# Patient Record
Sex: Female | Born: 1970 | Race: White | Hispanic: No | Marital: Married
Health system: Southern US, Community
[De-identification: ages and names within clinical notes are randomized; demographics above are authoritative.]

---

## 2014-01-01 ENCOUNTER — Other Ambulatory Visit (HOSPITAL_COMMUNITY): Payer: Self-pay | Admitting: Internal Medicine

## 2014-01-01 DIAGNOSIS — Z1231 Encounter for screening mammogram for malignant neoplasm of breast: Secondary | ICD-10-CM

## 2014-01-07 ENCOUNTER — Ambulatory Visit (HOSPITAL_COMMUNITY)
Admission: RE | Admit: 2014-01-07 | Discharge: 2014-01-07 | Disposition: A | Discharge status: Home / Self Care | Payer: 59 | Source: Ambulatory Visit | Attending: Internal Medicine | Admitting: Internal Medicine

## 2014-01-07 DIAGNOSIS — Z1231 Encounter for screening mammogram for malignant neoplasm of breast: Secondary | ICD-10-CM | POA: Diagnosis not present

## 2015-02-11 ENCOUNTER — Other Ambulatory Visit (HOSPITAL_COMMUNITY): Payer: Self-pay | Admitting: Internal Medicine

## 2015-02-11 ENCOUNTER — Ambulatory Visit (HOSPITAL_COMMUNITY)
Admission: RE | Admit: 2015-02-11 | Discharge: 2015-02-11 | Disposition: A | Discharge status: Home / Self Care | Payer: 59 | Source: Ambulatory Visit | Attending: Internal Medicine | Admitting: Internal Medicine

## 2015-02-11 DIAGNOSIS — Z1231 Encounter for screening mammogram for malignant neoplasm of breast: Secondary | ICD-10-CM

## 2015-03-12 DIAGNOSIS — H52223 Regular astigmatism, bilateral: Secondary | ICD-10-CM | POA: Diagnosis not present

## 2015-03-12 DIAGNOSIS — H5213 Myopia, bilateral: Secondary | ICD-10-CM | POA: Diagnosis not present

## 2015-07-21 DIAGNOSIS — F902 Attention-deficit hyperactivity disorder, combined type: Secondary | ICD-10-CM | POA: Diagnosis not present

## 2015-07-21 DIAGNOSIS — G43001 Migraine without aura, not intractable, with status migrainosus: Secondary | ICD-10-CM | POA: Diagnosis not present

## 2015-07-21 DIAGNOSIS — Z0000 Encounter for general adult medical examination without abnormal findings: Secondary | ICD-10-CM | POA: Diagnosis not present

## 2015-07-21 DIAGNOSIS — M5440 Lumbago with sciatica, unspecified side: Secondary | ICD-10-CM | POA: Diagnosis not present

## 2015-08-12 DIAGNOSIS — G43001 Migraine without aura, not intractable, with status migrainosus: Secondary | ICD-10-CM | POA: Diagnosis not present

## 2015-08-12 DIAGNOSIS — F909 Attention-deficit hyperactivity disorder, unspecified type: Secondary | ICD-10-CM | POA: Diagnosis not present

## 2015-08-12 DIAGNOSIS — E782 Mixed hyperlipidemia: Secondary | ICD-10-CM | POA: Diagnosis not present

## 2016-09-19 DIAGNOSIS — H5213 Myopia, bilateral: Secondary | ICD-10-CM | POA: Diagnosis not present

## 2016-09-19 DIAGNOSIS — H52223 Regular astigmatism, bilateral: Secondary | ICD-10-CM | POA: Diagnosis not present

## 2016-09-19 DIAGNOSIS — H524 Presbyopia: Secondary | ICD-10-CM | POA: Diagnosis not present

## 2016-10-11 ENCOUNTER — Other Ambulatory Visit (HOSPITAL_COMMUNITY): Payer: Self-pay | Admitting: Internal Medicine

## 2016-10-11 DIAGNOSIS — Z1231 Encounter for screening mammogram for malignant neoplasm of breast: Secondary | ICD-10-CM

## 2016-10-16 ENCOUNTER — Ambulatory Visit (HOSPITAL_COMMUNITY)
Admission: RE | Admit: 2016-10-16 | Discharge: 2016-10-16 | Disposition: A | Discharge status: Home / Self Care | Payer: 59 | Source: Ambulatory Visit | Attending: Internal Medicine | Admitting: Internal Medicine

## 2016-10-16 DIAGNOSIS — Z1231 Encounter for screening mammogram for malignant neoplasm of breast: Secondary | ICD-10-CM | POA: Insufficient documentation

## 2017-01-24 DIAGNOSIS — H6691 Otitis media, unspecified, right ear: Secondary | ICD-10-CM | POA: Diagnosis not present

## 2017-01-24 DIAGNOSIS — M791 Myalgia, unspecified site: Secondary | ICD-10-CM | POA: Diagnosis not present

## 2017-01-24 DIAGNOSIS — J029 Acute pharyngitis, unspecified: Secondary | ICD-10-CM | POA: Diagnosis not present

## 2017-05-15 DIAGNOSIS — Z0000 Encounter for general adult medical examination without abnormal findings: Secondary | ICD-10-CM | POA: Diagnosis not present

## 2017-05-18 DIAGNOSIS — E782 Mixed hyperlipidemia: Secondary | ICD-10-CM | POA: Diagnosis not present

## 2017-05-18 DIAGNOSIS — Z0000 Encounter for general adult medical examination without abnormal findings: Secondary | ICD-10-CM | POA: Diagnosis not present

## 2017-05-18 DIAGNOSIS — F39 Unspecified mood [affective] disorder: Secondary | ICD-10-CM | POA: Diagnosis not present

## 2017-05-18 DIAGNOSIS — G43019 Migraine without aura, intractable, without status migrainosus: Secondary | ICD-10-CM | POA: Diagnosis not present

## 2017-05-18 DIAGNOSIS — R197 Diarrhea, unspecified: Secondary | ICD-10-CM | POA: Diagnosis not present

## 2017-05-18 DIAGNOSIS — Z6834 Body mass index (BMI) 34.0-34.9, adult: Secondary | ICD-10-CM | POA: Diagnosis not present

## 2017-07-13 DIAGNOSIS — M545 Low back pain: Secondary | ICD-10-CM | POA: Diagnosis not present

## 2017-07-13 DIAGNOSIS — G43019 Migraine without aura, intractable, without status migrainosus: Secondary | ICD-10-CM | POA: Diagnosis not present

## 2017-07-13 DIAGNOSIS — Z6834 Body mass index (BMI) 34.0-34.9, adult: Secondary | ICD-10-CM | POA: Diagnosis not present

## 2017-07-13 DIAGNOSIS — Z0000 Encounter for general adult medical examination without abnormal findings: Secondary | ICD-10-CM | POA: Diagnosis not present

## 2017-07-13 DIAGNOSIS — R197 Diarrhea, unspecified: Secondary | ICD-10-CM | POA: Diagnosis not present

## 2017-07-13 DIAGNOSIS — F39 Unspecified mood [affective] disorder: Secondary | ICD-10-CM | POA: Diagnosis not present

## 2017-07-13 DIAGNOSIS — E782 Mixed hyperlipidemia: Secondary | ICD-10-CM | POA: Diagnosis not present

## 2017-07-13 MED FILL — CYCLOBENZAPRINE 10 MG TAB: 10 | 30 days supply | Qty: 60 | Fill #0

## 2017-07-13 MED FILL — ESCITALOPRAM 5 MG TABLET: 5 | 30 days supply | Qty: 30 | Fill #0

## 2017-07-31 MED FILL — AZITHROMYCIN 250 MG TABLET: 250 | 5 days supply | Qty: 6 | Fill #0

## 2017-08-07 MED FILL — FLUCONAZOLE 150 MG TABS: 150 | 1 days supply | Qty: 1 | Fill #0

## 2017-08-08 MED FILL — SHIPPING COST: 1 days supply | Qty: 1 | Fill #0

## 2017-08-08 MED FILL — ESCITALOPRAM 5 MG TABLET: 5 | 30 days supply | Qty: 30 | Fill #1

## 2017-09-12 MED FILL — SHIPPING COST: 1 days supply | Qty: 1 | Fill #1

## 2017-09-12 MED FILL — ESCITALOPRAM 5 MG TABLET: 5 | 30 days supply | Qty: 30 | Fill #2

## 2017-09-26 DIAGNOSIS — Z6834 Body mass index (BMI) 34.0-34.9, adult: Secondary | ICD-10-CM | POA: Diagnosis not present

## 2017-09-26 DIAGNOSIS — F3481 Disruptive mood dysregulation disorder: Secondary | ICD-10-CM | POA: Diagnosis not present

## 2017-09-26 DIAGNOSIS — R41840 Attention and concentration deficit: Secondary | ICD-10-CM | POA: Diagnosis not present

## 2017-09-26 MED FILL — AMPHETAMINE-DEXTROAMPHETAMI: 10 | 30 days supply | Qty: 30 | Fill #0

## 2017-09-26 MED FILL — ESCITALOPRAM 10 MG TABLET: 10 | 90 days supply | Qty: 90 | Fill #0

## 2017-09-26 MED FILL — SHIPPING COST: 1 days supply | Qty: 1 | Fill #2

## 2017-10-24 DIAGNOSIS — M722 Plantar fascial fibromatosis: Secondary | ICD-10-CM | POA: Diagnosis not present

## 2017-10-24 DIAGNOSIS — M25531 Pain in right wrist: Secondary | ICD-10-CM | POA: Diagnosis not present

## 2017-10-30 MED FILL — SHIPPING COST: 1 days supply | Qty: 1 | Fill #3

## 2017-10-30 MED FILL — AMPHETAMINE-DEXTROAMPHETAMI: 10 | 30 days supply | Qty: 60 | Fill #0

## 2017-11-01 DIAGNOSIS — M25571 Pain in right ankle and joints of right foot: Secondary | ICD-10-CM | POA: Diagnosis not present

## 2017-11-07 DIAGNOSIS — M25571 Pain in right ankle and joints of right foot: Secondary | ICD-10-CM | POA: Diagnosis not present

## 2017-12-05 DIAGNOSIS — M545 Low back pain: Secondary | ICD-10-CM | POA: Diagnosis not present

## 2017-12-05 MED FILL — predniSONE 10 MG (21) TBPK: 10 | 6 days supply | Qty: 21 | Fill #0

## 2017-12-05 MED FILL — SHIPPING COST: 1 days supply | Qty: 1 | Fill #4

## 2017-12-28 MED FILL — ESCITALOPRAM 10 MG TABLET: 10 | 90 days supply | Qty: 90 | Fill #0

## 2017-12-28 MED FILL — SHIPPING COST: 1 days supply | Qty: 1 | Fill #5

## 2017-12-28 MED FILL — AMPHETAMINE-DEXTROAMPHETAMI: 10 | 30 days supply | Qty: 60 | Fill #0

## 2018-03-14 DIAGNOSIS — R41840 Attention and concentration deficit: Secondary | ICD-10-CM | POA: Diagnosis not present

## 2018-03-14 DIAGNOSIS — F39 Unspecified mood [affective] disorder: Secondary | ICD-10-CM | POA: Diagnosis not present

## 2018-03-14 MED FILL — ESCITALOPRAM 10 MG TABLET: 10 | 90 days supply | Qty: 90 | Fill #0

## 2018-03-14 MED FILL — SHIPPING COST: 1 days supply | Qty: 1 | Fill #6

## 2018-03-14 MED FILL — AMPHETAMINE-DEXTROAMPHETAMI: 10 | 30 days supply | Qty: 60 | Fill #0

## 2018-05-31 MED FILL — CYCLOBENZAPRINE HCL 10 MG T: 10 | 30 days supply | Qty: 60 | Fill #0

## 2018-05-31 MED FILL — AMPHETAMINE-DEXTROAMPHETAMI: 10 | 30 days supply | Qty: 60 | Fill #0

## 2018-05-31 MED FILL — ESCITALOPRAM 10 MG TABLET: 10 | 90 days supply | Qty: 90 | Fill #0

## 2018-05-31 MED FILL — RIZATRIPTAN BENZOATE 10 MG: 10 | 30 days supply | Qty: 12 | Fill #0

## 2018-07-17 DIAGNOSIS — G43019 Migraine without aura, intractable, without status migrainosus: Secondary | ICD-10-CM | POA: Diagnosis not present

## 2018-07-17 DIAGNOSIS — E782 Mixed hyperlipidemia: Secondary | ICD-10-CM | POA: Diagnosis not present

## 2018-07-17 DIAGNOSIS — F3481 Disruptive mood dysregulation disorder: Secondary | ICD-10-CM | POA: Diagnosis not present

## 2018-07-17 DIAGNOSIS — Z0000 Encounter for general adult medical examination without abnormal findings: Secondary | ICD-10-CM | POA: Diagnosis not present

## 2018-07-17 DIAGNOSIS — F39 Unspecified mood [affective] disorder: Secondary | ICD-10-CM | POA: Diagnosis not present

## 2018-07-17 DIAGNOSIS — R41840 Attention and concentration deficit: Secondary | ICD-10-CM | POA: Diagnosis not present

## 2018-07-17 DIAGNOSIS — M545 Low back pain: Secondary | ICD-10-CM | POA: Diagnosis not present

## 2018-07-17 DIAGNOSIS — R197 Diarrhea, unspecified: Secondary | ICD-10-CM | POA: Diagnosis not present

## 2018-07-17 DIAGNOSIS — Z6834 Body mass index (BMI) 34.0-34.9, adult: Secondary | ICD-10-CM | POA: Diagnosis not present

## 2018-07-24 DIAGNOSIS — E669 Obesity, unspecified: Secondary | ICD-10-CM | POA: Diagnosis not present

## 2018-07-24 DIAGNOSIS — R41840 Attention and concentration deficit: Secondary | ICD-10-CM | POA: Diagnosis not present

## 2018-07-24 DIAGNOSIS — G43019 Migraine without aura, intractable, without status migrainosus: Secondary | ICD-10-CM | POA: Diagnosis not present

## 2018-07-24 DIAGNOSIS — G4700 Insomnia, unspecified: Secondary | ICD-10-CM | POA: Diagnosis not present

## 2018-07-24 DIAGNOSIS — F39 Unspecified mood [affective] disorder: Secondary | ICD-10-CM | POA: Diagnosis not present

## 2018-07-24 DIAGNOSIS — Z0001 Encounter for general adult medical examination with abnormal findings: Secondary | ICD-10-CM | POA: Diagnosis not present

## 2018-07-24 DIAGNOSIS — E782 Mixed hyperlipidemia: Secondary | ICD-10-CM | POA: Diagnosis not present

## 2018-07-24 MED FILL — AMPHETAMINE-DEXTROAMPHETAMI: 10 | 30 days supply | Qty: 60 | Fill #0

## 2018-09-20 MED FILL — AMPHETAMINE-DEXTROAMPHETAMI: 10 | 30 days supply | Qty: 60 | Fill #0

## 2018-10-17 MED FILL — ESCITALOPRAM 10 MG TABLET: 10 | 90 days supply | Qty: 90 | Fill #1

## 2018-10-23 DIAGNOSIS — F39 Unspecified mood [affective] disorder: Secondary | ICD-10-CM | POA: Diagnosis not present

## 2018-10-23 DIAGNOSIS — R41840 Attention and concentration deficit: Secondary | ICD-10-CM | POA: Diagnosis not present

## 2018-10-23 DIAGNOSIS — G43019 Migraine without aura, intractable, without status migrainosus: Secondary | ICD-10-CM | POA: Diagnosis not present

## 2018-10-23 MED FILL — ESCITALOPRAM 20 MG TABLET: 20 | 30 days supply | Qty: 30 | Fill #0

## 2018-10-23 MED FILL — clonazePAM 0.5 MG TABS: 0.5 | 30 days supply | Qty: 30 | Fill #0

## 2018-10-23 MED FILL — AMPHETAMINE-DEXTROAMPHETAMI: 10 | 30 days supply | Qty: 60 | Fill #0

## 2018-12-02 ENCOUNTER — Other Ambulatory Visit (HOSPITAL_COMMUNITY): Payer: Self-pay | Admitting: Internal Medicine

## 2018-12-02 DIAGNOSIS — Z1231 Encounter for screening mammogram for malignant neoplasm of breast: Secondary | ICD-10-CM

## 2018-12-12 ENCOUNTER — Ambulatory Visit (HOSPITAL_COMMUNITY): Payer: 59

## 2018-12-17 MED FILL — RIZATRIPTAN BENZOATE 10 MG: 10 | 20 days supply | Qty: 12 | Fill #0

## 2018-12-17 MED FILL — AMPHETAMINE-DEXTROAMPHETAMI: 10 | 30 days supply | Qty: 60 | Fill #0

## 2018-12-31 MED FILL — ESCITALOPRAM 20 MG TABLET: 20 | 30 days supply | Qty: 30 | Fill #1

## 2019-01-29 MED FILL — AMPHETAMINE-DEXTROAMPHETAMI: 10 | 30 days supply | Qty: 60 | Fill #0

## 2019-02-19 MED FILL — ESCITALOPRAM 20 MG TABLET: 20 | 30 days supply | Qty: 30 | Fill #2

## 2019-03-19 MED FILL — AMPHETAMINE-DEXTROAMPHETAMI: 10 | 30 days supply | Qty: 60 | Fill #0

## 2019-03-21 DIAGNOSIS — G43019 Migraine without aura, intractable, without status migrainosus: Secondary | ICD-10-CM | POA: Diagnosis not present

## 2019-03-21 DIAGNOSIS — F411 Generalized anxiety disorder: Secondary | ICD-10-CM | POA: Diagnosis not present

## 2019-03-21 DIAGNOSIS — R41840 Attention and concentration deficit: Secondary | ICD-10-CM | POA: Diagnosis not present

## 2019-03-21 DIAGNOSIS — F331 Major depressive disorder, recurrent, moderate: Secondary | ICD-10-CM | POA: Diagnosis not present

## 2019-05-05 ENCOUNTER — Ambulatory Visit (HOSPITAL_COMMUNITY): Payer: 59

## 2019-05-07 MED FILL — AMPHETAMINE-DEXTROAMPHETAMI: 10 | 30 days supply | Qty: 60 | Fill #0

## 2019-05-07 MED FILL — ESCITALOPRAM 10 MG TABLET: 10 | 90 days supply | Qty: 90 | Fill #0

## 2019-05-19 ENCOUNTER — Ambulatory Visit (HOSPITAL_COMMUNITY): Payer: 59

## 2019-05-22 ENCOUNTER — Other Ambulatory Visit: Payer: Self-pay

## 2019-05-22 ENCOUNTER — Encounter (HOSPITAL_COMMUNITY): Payer: Self-pay

## 2019-05-22 ENCOUNTER — Ambulatory Visit (HOSPITAL_COMMUNITY)
Admission: RE | Admit: 2019-05-22 | Discharge: 2019-05-22 | Disposition: A | Discharge status: Home / Self Care | Payer: 59 | Source: Ambulatory Visit | Attending: Internal Medicine | Admitting: Internal Medicine

## 2019-05-22 DIAGNOSIS — Z1231 Encounter for screening mammogram for malignant neoplasm of breast: Secondary | ICD-10-CM | POA: Insufficient documentation

## 2019-05-26 ENCOUNTER — Other Ambulatory Visit (HOSPITAL_COMMUNITY): Payer: Self-pay | Admitting: Internal Medicine

## 2019-05-26 DIAGNOSIS — R928 Other abnormal and inconclusive findings on diagnostic imaging of breast: Secondary | ICD-10-CM

## 2019-06-03 ENCOUNTER — Other Ambulatory Visit: Payer: Self-pay

## 2019-06-03 ENCOUNTER — Ambulatory Visit (HOSPITAL_COMMUNITY)
Admission: RE | Admit: 2019-06-03 | Discharge: 2019-06-03 | Disposition: A | Discharge status: Home / Self Care | Payer: 59 | Source: Ambulatory Visit | Attending: Internal Medicine | Admitting: Internal Medicine

## 2019-06-03 DIAGNOSIS — N922 Excessive menstruation at puberty: Secondary | ICD-10-CM | POA: Diagnosis not present

## 2019-06-03 DIAGNOSIS — R928 Other abnormal and inconclusive findings on diagnostic imaging of breast: Secondary | ICD-10-CM

## 2019-06-03 DIAGNOSIS — N6489 Other specified disorders of breast: Secondary | ICD-10-CM | POA: Diagnosis not present

## 2019-08-06 MED FILL — AMPHETAMINE SALTS 10 MG: 10 | 30 days supply | Qty: 60 | Fill #0

## 2019-09-10 MED FILL — ESCITALOPRAM 10 MG TABLET: 10 | 90 days supply | Qty: 90 | Fill #1

## 2019-09-16 MED FILL — RIZATRIPTAN BENZOATE 10 MG: 10 | 20 days supply | Qty: 12 | Fill #1

## 2019-10-01 MED FILL — AMPHETAMINE SALTS 10 MG: 10 | 30 days supply | Qty: 60 | Fill #0

## 2019-11-13 ENCOUNTER — Ambulatory Visit (INDEPENDENT_AMBULATORY_CARE_PROVIDER_SITE_OTHER): Payer: 59

## 2019-11-13 ENCOUNTER — Encounter: Payer: Self-pay | Admitting: Podiatry

## 2019-11-13 ENCOUNTER — Other Ambulatory Visit: Payer: Self-pay

## 2019-11-13 ENCOUNTER — Ambulatory Visit: Payer: 59 | Admitting: Podiatry

## 2019-11-13 DIAGNOSIS — R52 Pain, unspecified: Secondary | ICD-10-CM

## 2019-11-13 DIAGNOSIS — M722 Plantar fascial fibromatosis: Secondary | ICD-10-CM | POA: Diagnosis not present

## 2019-11-13 MED ORDER — DICLOFENAC SODIUM 75 MG PO TBEC: 75.0000 mg | DELAYED_RELEASE_TABLET | Freq: Two times a day (BID) | ORAL | 2 refills | Status: AC

## 2019-11-13 NOTE — Patient Instructions (Signed)
Plantar  Plantar Fasciitis  Plantar fasciitis is a painful foot condition that affects the heel. It occurs when the band of tissue that connects the toes to the heel bone (plantar fascia) becomes irritated. This can happen as the result of exercising too much or doing other repetitive activities (overuse injury). The pain from plantar fasciitis can range from mild irritation to severe pain that makes it difficult to walk or move. The pain is usually worse in the morning after sleeping, or after sitting or lying down for a while. Pain may also be worse after long periods of walking or standing. What are the causes? This condition may be caused by:  Standing for long periods of time.  Wearing shoes that do not have good arch support.  Doing activities that put stress on joints (high-impact activities), including running, aerobics, and ballet.  Being overweight.  An abnormal way of walking (gait).  Tight muscles in the back of your lower leg (calf).  High arches in your feet.  Starting a new athletic activity. What are the signs or symptoms? The main symptom of this condition is heel pain. Pain may:  Be worse with first steps after a time of rest, especially in the morning after sleeping or after you have been sitting or lying down for a while.  Be worse after long periods of standing still.  Decrease after 30-45 minutes of activity, such as gentle walking. How is this diagnosed? This condition may be diagnosed based on your medical history and your symptoms. Your health care provider may ask questions about your activity level. Your health care provider will do a physical exam to check for:  A tender area on the bottom of your foot.  A high arch in your foot.  Pain when you move your foot.  Difficulty moving your foot. You may have imaging tests to confirm the diagnosis, such as:  X-rays.  Ultrasound.  MRI. How is this treated? Treatment for plantar fasciitis depends on  how severe your condition is. Treatment may include:  Rest, ice, applying pressure (compression), and raising the affected foot (elevation). This may be called RICE therapy. Your health care provider may recommend RICE therapy along with over-the-counter pain medicines to manage your pain.  Exercises to stretch your calves and your plantar fascia.  A splint that holds your foot in a stretched, upward position while you sleep (night splint).  Physical therapy to relieve symptoms and prevent problems in the future.  Injections of steroid medicine (cortisone) to relieve pain and inflammation.  Stimulating your plantar fascia with electrical impulses (extracorporeal shock wave therapy). This is usually the last treatment option before surgery.  Surgery, if other treatments have not worked after 12 months. Follow these instructions at home:  Managing pain, stiffness, and swelling  If directed, put ice on the painful area: ? Put ice in a plastic bag, or use a frozen bottle of water. ? Place a towel between your skin and the bag or bottle. ? Roll the bottom of your foot over the bag or bottle. ? Do this for 20 minutes, 2-3 times a day.  Wear athletic shoes that have air-sole or gel-sole cushions, or try wearing soft shoe inserts that are designed for plantar fasciitis.  Raise (elevate) your foot above the level of your heart while you are sitting or lying down. Activity  Avoid activities that cause pain. Ask your health care provider what activities are safe for you.  Do physical therapy exercises and stretches  as told by your health care provider.  Try activities and forms of exercise that are easier on your joints (low-impact). Examples include swimming, water aerobics, and biking. General instructions  Take over-the-counter and prescription medicines only as told by your health care provider.  Wear a night splint while sleeping, if told by your health care provider. Loosen the  splint if your toes tingle, become numb, or turn cold and blue.  Maintain a healthy weight, or work with your health care provider to lose weight as needed.  Keep all follow-up visits as told by your health care provider. This is important. Contact a health care provider if you:  Have symptoms that do not go away after caring for yourself at home.  Have pain that gets worse.  Have pain that affects your ability to move or do your daily activities. Summary  Plantar fasciitis is a painful foot condition that affects the heel. It occurs when the band of tissue that connects the toes to the heel bone (plantar fascia) becomes irritated.  The main symptom of this condition is heel pain that may be worse after exercising too much or standing still for a long time.  Treatment varies, but it usually starts with rest, ice, compression, and elevation (RICE therapy) and over-the-counter medicines to manage pain. This information is not intended to replace advice given to you by your health care provider. Make sure you discuss any questions you have with your health care provider. Document Revised: 01/05/2017 Document Reviewed: 11/20/2016 Elsevier Patient Education  2020 [employer].

## 2019-11-15 NOTE — Progress Notes (Signed)
Subjective:   Patient ID: Jody Hall, female   DOB: 49 y.o.   MRN: [id number]   HPI Patient presents stating she has had several months of discomfort in her right heel and states that she does not remember specific injury.  States is been getting gradually worse recently and she has trouble with ambulation and patient does not smoke likes to be active   Review of Systems  All other systems reviewed and are negative.       Objective:  Physical Exam Vitals and nursing note reviewed.  Constitutional:      Appearance: She is well-developed.  Pulmonary:     Effort: Pulmonary effort is normal.  Musculoskeletal:        General: Normal range of motion.  Skin:    General: Skin is warm.  Neurological:     Mental Status: She is alert.     Neurovascular status was found to be intact muscle strength adequate range of motion was within normal limits.  Patient is noted to have exquisite discomfort plantar aspect right heel at the insertional point of the tendon into the calcaneus inflammation fluid around the medial band with moderate depression of the arch noted.  Patient has good digit perfusion well oriented and is on her feet for 12-hour shifts     Assessment:  Acute plantar fasciitis right inflammation fluid medial band     Plan:  H&P x-rays reviewed condition discussed.  I went ahead did sterile prep injected the fascia 3 mg Kenalog 5 mg Xylocaine and applied fascial brace with instructions and also gave instructions for physical therapy and shoe gear modifications.  Patient will be seen back in the next several weeks  X-rays indicate that there is small spur no indication stress fracture moderate depression of the arch

## 2019-11-21 ENCOUNTER — Other Ambulatory Visit (HOSPITAL_COMMUNITY): Payer: Self-pay | Admitting: Adult Health Nurse Practitioner

## 2019-11-21 MED FILL — clonazePAM 0.5 MG TABS: 0.5 | 30 days supply | Qty: 30 | Fill #0

## 2019-11-21 MED FILL — AMPHETAMINE SALTS 10 MG: 10 | 30 days supply | Qty: 60 | Fill #0

## 2019-11-24 ENCOUNTER — Telehealth: Payer: Self-pay | Admitting: Podiatry

## 2019-11-24 NOTE — Telephone Encounter (Signed)
Pt called and stated she never received her anti-inflammatory meds. Could you please send to her pharmacy. Pt pharmacy was aaded to chart

## 2019-11-26 ENCOUNTER — Ambulatory Visit: Payer: 59 | Admitting: Podiatry

## 2019-11-26 ENCOUNTER — Other Ambulatory Visit: Payer: Self-pay | Admitting: Podiatry

## 2019-11-26 MED ORDER — DICLOFENAC SODIUM 75 MG PO TBEC: 75.0000 mg | DELAYED_RELEASE_TABLET | Freq: Two times a day (BID) | ORAL | 2 refills | Status: DC

## 2019-11-26 MED FILL — DICLOFENAC SODIUM 75 MG TAB: 75 | 25 days supply | Qty: 50 | Fill #0

## 2019-11-26 NOTE — Telephone Encounter (Signed)
I resent it today.

## 2019-12-10 ENCOUNTER — Other Ambulatory Visit: Payer: Self-pay

## 2019-12-10 ENCOUNTER — Ambulatory Visit: Payer: 59 | Admitting: Podiatry

## 2019-12-10 ENCOUNTER — Encounter: Payer: Self-pay | Admitting: Podiatry

## 2019-12-10 DIAGNOSIS — M722 Plantar fascial fibromatosis: Secondary | ICD-10-CM | POA: Diagnosis not present

## 2019-12-10 NOTE — Progress Notes (Signed)
Subjective:   Patient ID: Jody Hall, female   DOB: 49 y.o.   MRN: [id number]   HPI Patient states right now she seems to be doing a lot better but she is wearing her boot most of the time   ROS      Objective:  Physical Exam  Neurovascular status intact intense plantar fascial symptomatology right which has improved but still is moderately painful with deep palpation     Assessment:  Acute fasciitis-like symptomatology right improved but present     Plan:  H&P reviewed condition and recommended continuation of boot therapy but gradual reduction over the next 4 weeks and discussed continued treatment for chronic fasciitis-like problem and all other issues associated with this.  Patient will be seen back and we answered all questions and did discuss the possibility for surgical intervention at 1 point in the future

## 2019-12-12 ENCOUNTER — Other Ambulatory Visit (HOSPITAL_COMMUNITY): Payer: Self-pay | Admitting: Adult Health Nurse Practitioner

## 2019-12-12 DIAGNOSIS — N3000 Acute cystitis without hematuria: Secondary | ICD-10-CM | POA: Diagnosis not present

## 2019-12-12 MED FILL — FLUCONAZOLE 150 MG TABS: 150 | 3 days supply | Qty: 2 | Fill #0

## 2019-12-12 MED FILL — NITROFURANTOIN MONO-MCR 100: 100 | 7 days supply | Qty: 14 | Fill #0

## 2020-01-07 DIAGNOSIS — Z23 Encounter for immunization: Secondary | ICD-10-CM | POA: Diagnosis not present

## 2020-01-16 ENCOUNTER — Other Ambulatory Visit (HOSPITAL_COMMUNITY): Payer: Self-pay | Admitting: Adult Health Nurse Practitioner

## 2020-01-16 MED FILL — AMPHETAMINE SALTS 10 MG: 10 | 30 days supply | Qty: 60 | Fill #0

## 2020-03-18 DIAGNOSIS — F39 Unspecified mood [affective] disorder: Secondary | ICD-10-CM | POA: Diagnosis not present

## 2020-03-18 DIAGNOSIS — E669 Obesity, unspecified: Secondary | ICD-10-CM | POA: Diagnosis not present

## 2020-03-18 DIAGNOSIS — G43019 Migraine without aura, intractable, without status migrainosus: Secondary | ICD-10-CM | POA: Diagnosis not present

## 2020-03-18 DIAGNOSIS — E782 Mixed hyperlipidemia: Secondary | ICD-10-CM | POA: Diagnosis not present

## 2020-03-18 DIAGNOSIS — R41840 Attention and concentration deficit: Secondary | ICD-10-CM | POA: Diagnosis not present

## 2020-03-18 DIAGNOSIS — Z0001 Encounter for general adult medical examination with abnormal findings: Secondary | ICD-10-CM | POA: Diagnosis not present

## 2020-03-18 DIAGNOSIS — Z6832 Body mass index (BMI) 32.0-32.9, adult: Secondary | ICD-10-CM | POA: Diagnosis not present

## 2020-03-18 DIAGNOSIS — Z0000 Encounter for general adult medical examination without abnormal findings: Secondary | ICD-10-CM | POA: Diagnosis not present

## 2020-03-18 DIAGNOSIS — F3481 Disruptive mood dysregulation disorder: Secondary | ICD-10-CM | POA: Diagnosis not present

## 2020-03-20 ENCOUNTER — Other Ambulatory Visit (HOSPITAL_COMMUNITY): Payer: Self-pay | Admitting: Student

## 2020-03-20 DIAGNOSIS — E782 Mixed hyperlipidemia: Secondary | ICD-10-CM | POA: Diagnosis not present

## 2020-03-20 DIAGNOSIS — F411 Generalized anxiety disorder: Secondary | ICD-10-CM | POA: Diagnosis not present

## 2020-03-20 DIAGNOSIS — F331 Major depressive disorder, recurrent, moderate: Secondary | ICD-10-CM | POA: Diagnosis not present

## 2020-03-20 DIAGNOSIS — G43019 Migraine without aura, intractable, without status migrainosus: Secondary | ICD-10-CM | POA: Diagnosis not present

## 2020-03-20 DIAGNOSIS — R41840 Attention and concentration deficit: Secondary | ICD-10-CM | POA: Diagnosis not present

## 2020-03-20 MED FILL — METHOCARBAMOL 500 MG TABS: 500 | 14 days supply | Qty: 56 | Fill #0

## 2020-03-20 MED FILL — AMPHETAMINE SALTS 10 MG: 10 | 30 days supply | Qty: 60 | Fill #0

## 2020-03-20 MED FILL — RIZATRIPTAN BENZOATE 10 MG: 10 | 28 days supply | Qty: 12 | Fill #0

## 2020-03-20 MED FILL — clonazePAM 0.5 MG TABS: 0.5 | 30 days supply | Qty: 30 | Fill #0

## 2020-04-29 ENCOUNTER — Other Ambulatory Visit (HOSPITAL_BASED_OUTPATIENT_CLINIC_OR_DEPARTMENT_OTHER): Payer: Self-pay

## 2020-05-19 ENCOUNTER — Other Ambulatory Visit (HOSPITAL_COMMUNITY): Payer: Self-pay

## 2020-05-20 ENCOUNTER — Other Ambulatory Visit (HOSPITAL_COMMUNITY): Payer: Self-pay

## 2020-05-20 MED ORDER — AMPHETAMINE-DEXTROAMPHETAMINE 10 MG PO TABS
10.0000 mg | ORAL_TABLET | Freq: Two times a day (BID) | ORAL | 0 refills | Status: AC
  Filled 2020-05-20: qty 60, 30d supply, fill #0

## 2020-05-21 ENCOUNTER — Other Ambulatory Visit (HOSPITAL_COMMUNITY): Payer: Self-pay

## 2020-05-21 ENCOUNTER — Other Ambulatory Visit (HOSPITAL_BASED_OUTPATIENT_CLINIC_OR_DEPARTMENT_OTHER): Payer: Self-pay

## 2020-05-24 ENCOUNTER — Other Ambulatory Visit (HOSPITAL_COMMUNITY): Payer: Self-pay

## 2020-06-12 IMAGING — MG DIGITAL SCREENING BILAT W/ TOMO W/ CAD
8 series · 8 of 24 positions shown · non-contrast
Comparison: Previous exam(s).

CLINICAL DATA: Screening.

EXAM:
DIGITAL SCREENING BILATERAL MAMMOGRAM WITH TOMO AND CAD

[R MLO synth-2D]
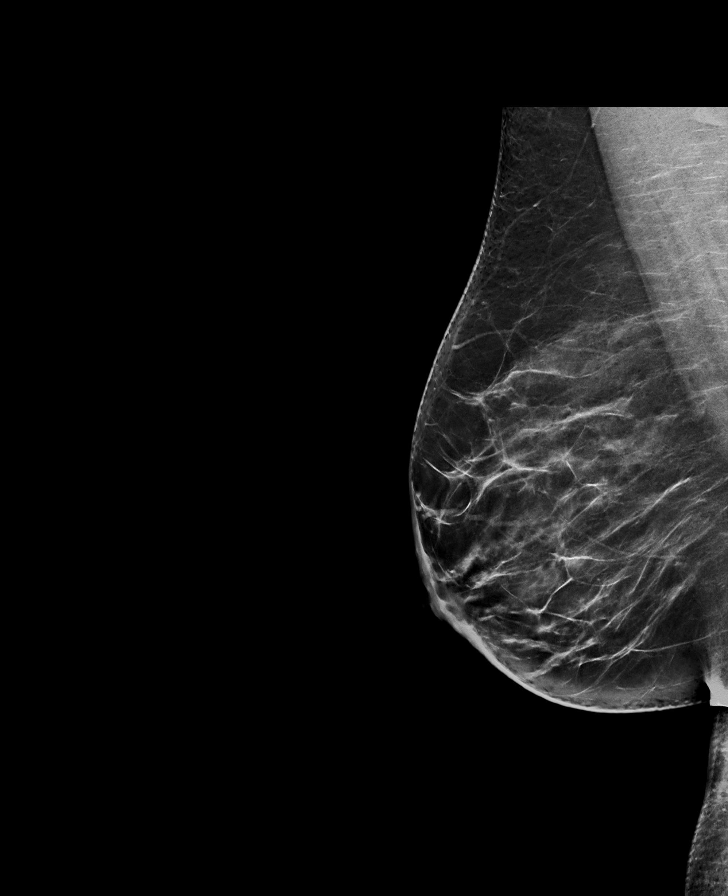

[R CC synth-2D]
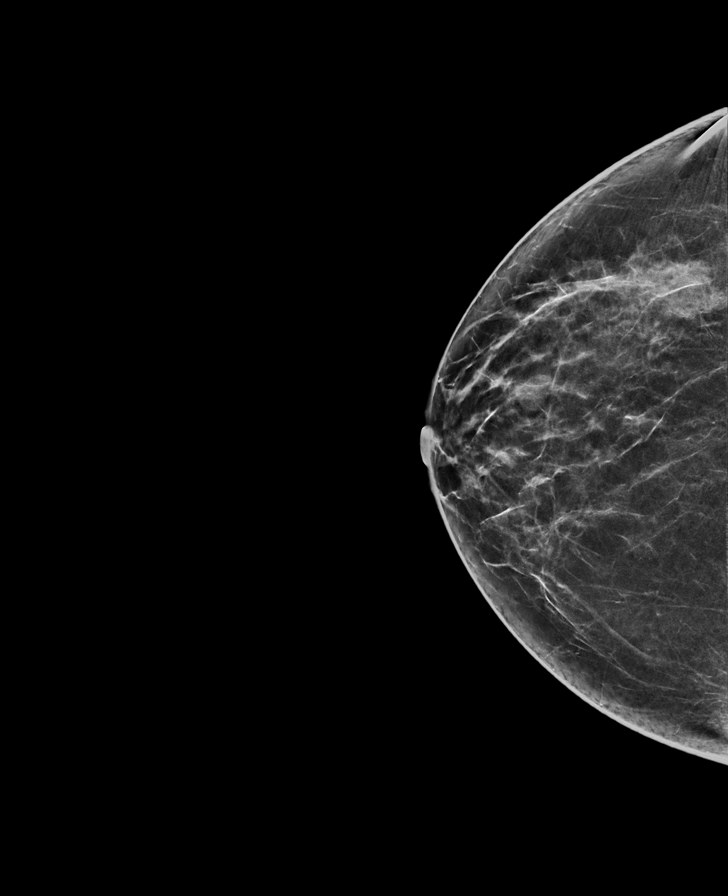

[L CC synth-2D]
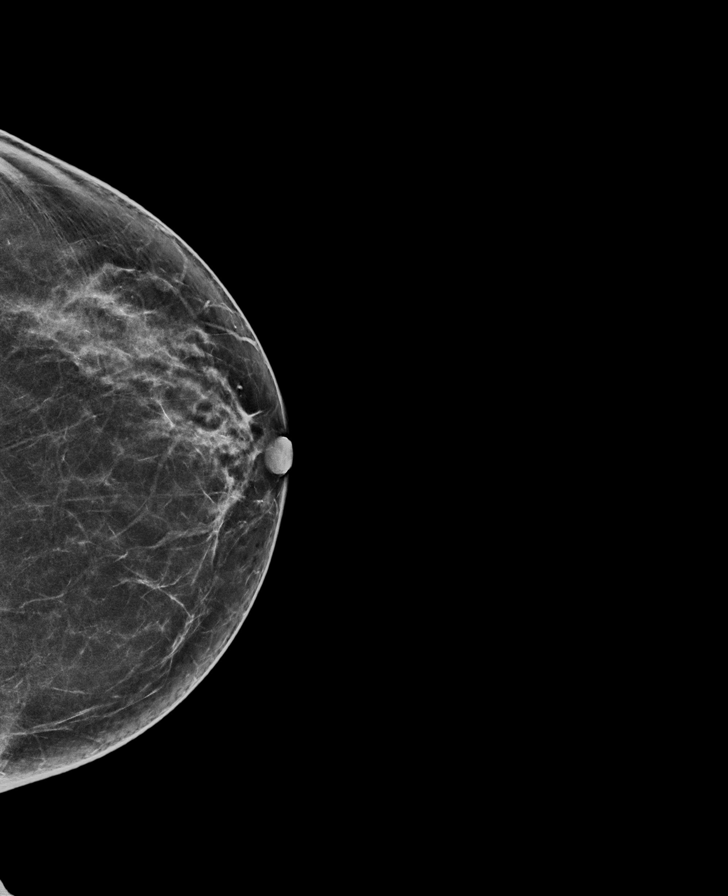

[L MLO synth-2D]
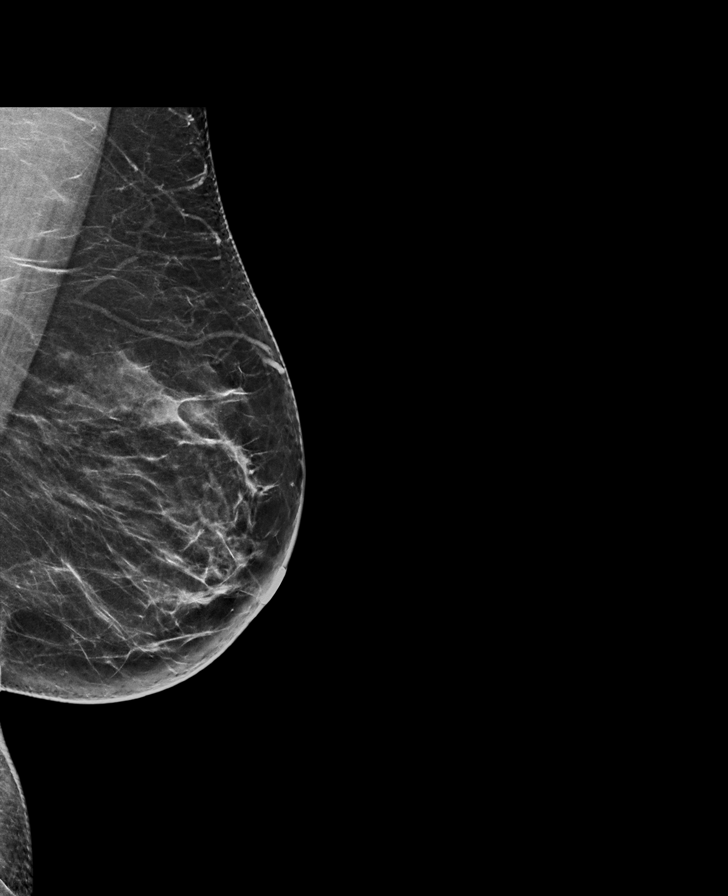

[L MLO tomo · tomo slice 37/72.0]
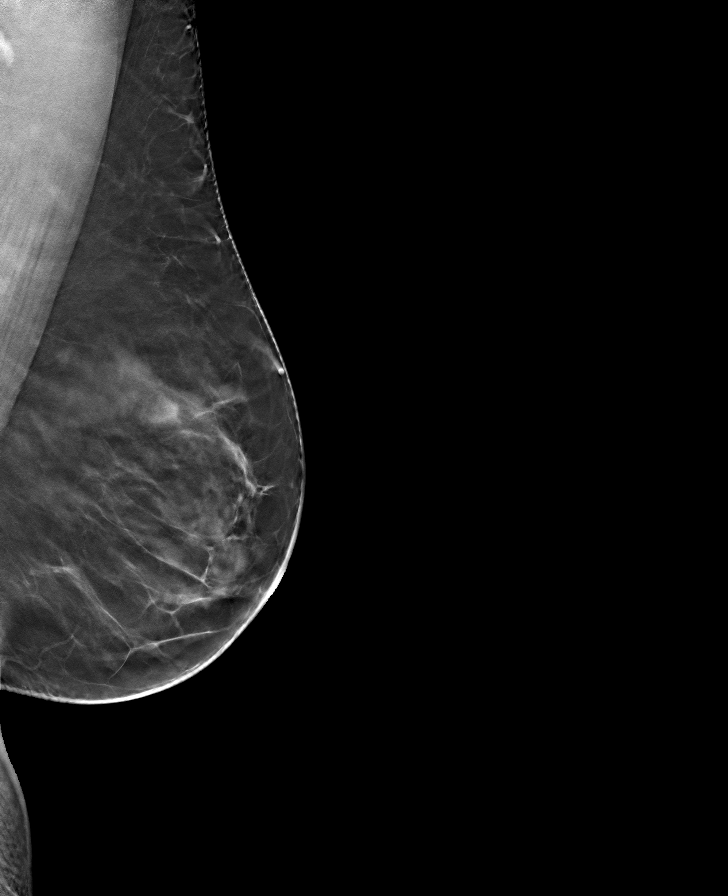

[R CC tomo · tomo slice 34/67.0]
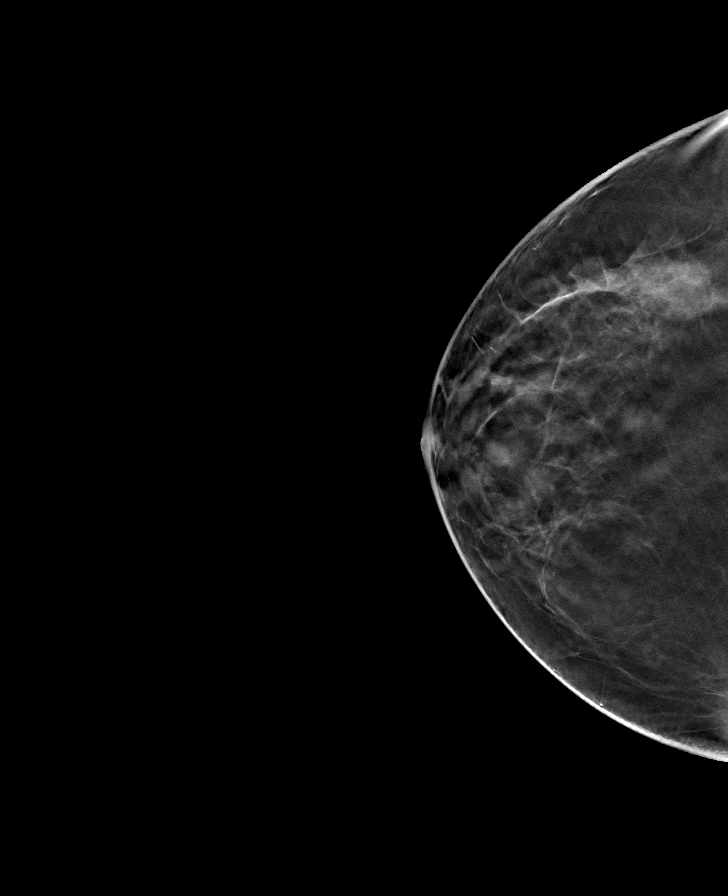

[R MLO tomo · tomo slice 40/79.0]
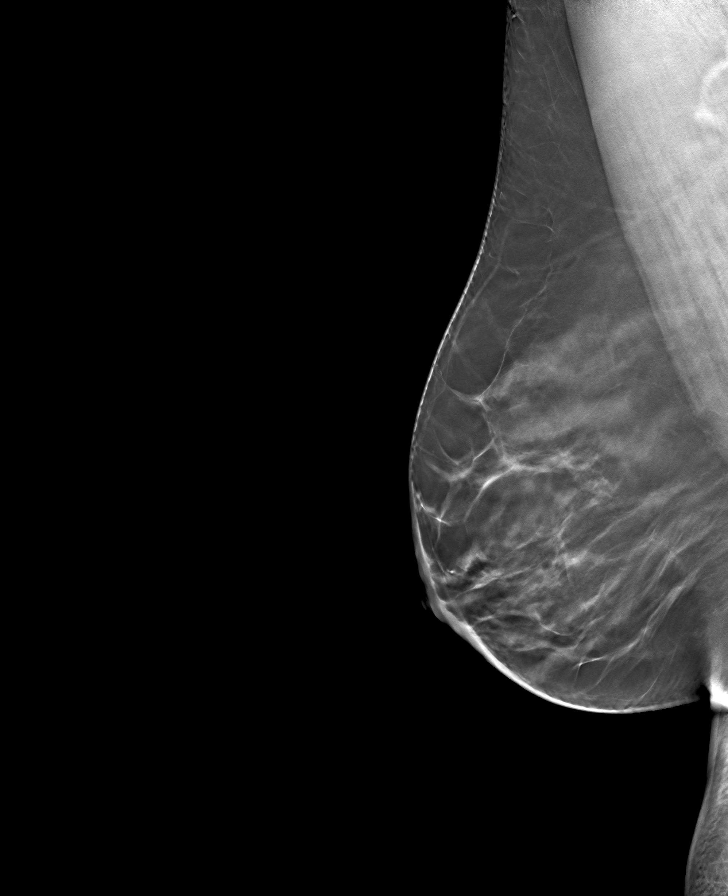

[L CC tomo · tomo slice 33/66.0]
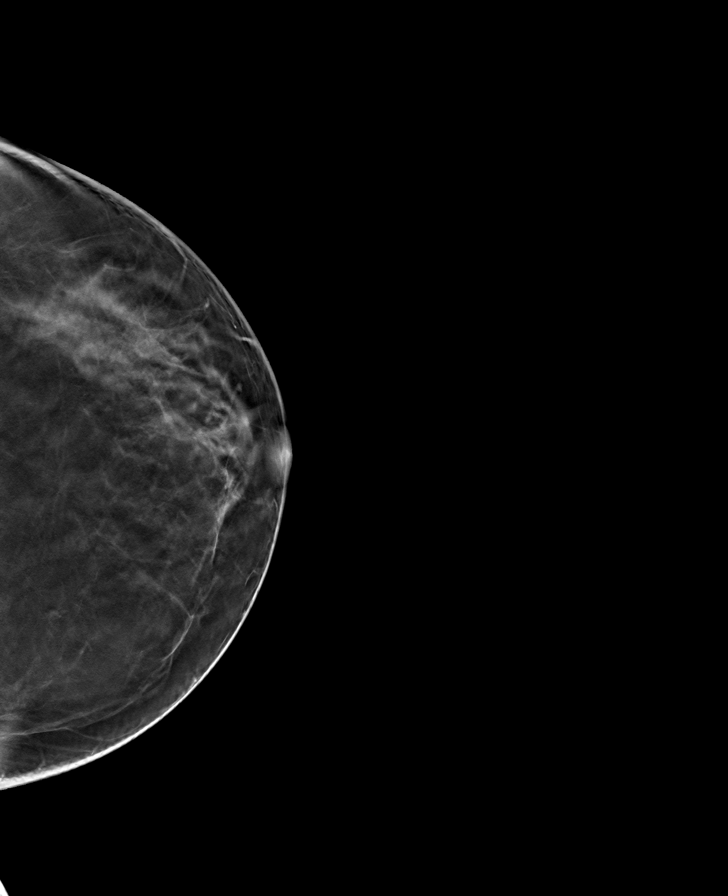

[8 of 24 positions shown; findings below may reference images not displayed]

ACR Breast Density Category b: There are scattered areas of
fibroglandular density.
FINDINGS: In the right breast, a possible mass warrants further evaluation. In
the left breast, no findings suspicious for malignancy. Images were
processed with CAD.
IMPRESSION: Further evaluation is suggested for possible mass in the right
breast.

RECOMMENDATION:
Diagnostic mammogram and possibly ultrasound of the right breast.
(Code:T1-A-550)

The patient will be contacted regarding the findings, and additional
imaging will be scheduled.

BI-RADS CATEGORY  0: Incomplete. Need additional imaging evaluation
and/or prior mammograms for comparison.

## 2020-07-01 DIAGNOSIS — F329 Major depressive disorder, single episode, unspecified: Secondary | ICD-10-CM | POA: Diagnosis not present

## 2020-07-01 DIAGNOSIS — F909 Attention-deficit hyperactivity disorder, unspecified type: Secondary | ICD-10-CM | POA: Diagnosis not present

## 2020-07-01 DIAGNOSIS — E782 Mixed hyperlipidemia: Secondary | ICD-10-CM | POA: Diagnosis not present

## 2020-07-01 DIAGNOSIS — Z0001 Encounter for general adult medical examination with abnormal findings: Secondary | ICD-10-CM | POA: Diagnosis not present

## 2020-07-01 DIAGNOSIS — G43909 Migraine, unspecified, not intractable, without status migrainosus: Secondary | ICD-10-CM | POA: Diagnosis not present

## 2020-07-13 DIAGNOSIS — E782 Mixed hyperlipidemia: Secondary | ICD-10-CM | POA: Diagnosis not present

## 2020-07-19 ENCOUNTER — Other Ambulatory Visit (HOSPITAL_COMMUNITY): Payer: Self-pay

## 2020-07-20 ENCOUNTER — Other Ambulatory Visit (HOSPITAL_COMMUNITY): Payer: Self-pay

## 2020-07-27 ENCOUNTER — Other Ambulatory Visit (HOSPITAL_COMMUNITY): Payer: Self-pay

## 2020-07-27 MED ORDER — AMPHETAMINE-DEXTROAMPHETAMINE 10 MG PO TABS
10.0000 mg | ORAL_TABLET | Freq: Two times a day (BID) | ORAL | 0 refills | Status: DC
  Filled 2020-07-27: qty 60, 30d supply, fill #0

## 2020-07-28 ENCOUNTER — Other Ambulatory Visit (HOSPITAL_COMMUNITY): Payer: Self-pay

## 2020-08-31 ENCOUNTER — Other Ambulatory Visit (HOSPITAL_COMMUNITY): Payer: Self-pay | Admitting: Student

## 2020-08-31 ENCOUNTER — Other Ambulatory Visit (HOSPITAL_COMMUNITY): Payer: Self-pay

## 2020-08-31 DIAGNOSIS — Z1231 Encounter for screening mammogram for malignant neoplasm of breast: Secondary | ICD-10-CM

## 2020-08-31 MED ORDER — AMPHETAMINE-DEXTROAMPHETAMINE 10 MG PO TABS
10.0000 mg | ORAL_TABLET | Freq: Two times a day (BID) | ORAL | 0 refills | Status: AC
  Filled 2020-08-31: qty 60, 30d supply, fill #0

## 2020-09-02 ENCOUNTER — Other Ambulatory Visit: Payer: Self-pay

## 2020-09-02 ENCOUNTER — Ambulatory Visit (HOSPITAL_COMMUNITY)
Admission: RE | Admit: 2020-09-02 | Discharge: 2020-09-02 | Disposition: A | Discharge status: Home / Self Care | Payer: 59 | Source: Ambulatory Visit | Attending: Student | Admitting: Student

## 2020-09-02 DIAGNOSIS — Z1231 Encounter for screening mammogram for malignant neoplasm of breast: Secondary | ICD-10-CM | POA: Insufficient documentation

## 2020-11-23 ENCOUNTER — Other Ambulatory Visit (HOSPITAL_COMMUNITY): Payer: Self-pay

## 2020-11-23 MED ORDER — IBUPROFEN 600 MG PO TABS
ORAL_TABLET | ORAL | 0 refills | Status: AC
  Filled 2020-11-23: qty 10, 2d supply, fill #0

## 2020-11-23 MED ORDER — AMOXICILLIN 500 MG PO CAPS
ORAL_CAPSULE | ORAL | 0 refills | Status: AC
  Filled 2020-11-23: qty 28, 7d supply, fill #0

## 2020-12-20 ENCOUNTER — Other Ambulatory Visit (HOSPITAL_COMMUNITY): Payer: Self-pay

## 2020-12-20 MED FILL — Methocarbamol Tab 500 MG: ORAL | 9 days supply | Qty: 34 | Fill #0 | Status: AC

## 2021-07-14 DIAGNOSIS — E782 Mixed hyperlipidemia: Secondary | ICD-10-CM | POA: Diagnosis not present

## 2021-07-20 ENCOUNTER — Other Ambulatory Visit (HOSPITAL_COMMUNITY): Payer: Self-pay | Admitting: Family Medicine

## 2021-07-20 ENCOUNTER — Other Ambulatory Visit (HOSPITAL_COMMUNITY): Payer: Self-pay

## 2021-07-20 DIAGNOSIS — Z0001 Encounter for general adult medical examination with abnormal findings: Secondary | ICD-10-CM | POA: Diagnosis not present

## 2021-07-20 DIAGNOSIS — G43909 Migraine, unspecified, not intractable, without status migrainosus: Secondary | ICD-10-CM | POA: Diagnosis not present

## 2021-07-20 DIAGNOSIS — H538 Other visual disturbances: Secondary | ICD-10-CM | POA: Diagnosis not present

## 2021-07-20 DIAGNOSIS — Z1211 Encounter for screening for malignant neoplasm of colon: Secondary | ICD-10-CM | POA: Diagnosis not present

## 2021-07-20 DIAGNOSIS — E782 Mixed hyperlipidemia: Secondary | ICD-10-CM | POA: Diagnosis not present

## 2021-07-20 DIAGNOSIS — Z1231 Encounter for screening mammogram for malignant neoplasm of breast: Secondary | ICD-10-CM

## 2021-07-20 DIAGNOSIS — R7301 Impaired fasting glucose: Secondary | ICD-10-CM | POA: Diagnosis not present

## 2021-07-20 DIAGNOSIS — F329 Major depressive disorder, single episode, unspecified: Secondary | ICD-10-CM | POA: Diagnosis not present

## 2021-07-20 DIAGNOSIS — F909 Attention-deficit hyperactivity disorder, unspecified type: Secondary | ICD-10-CM | POA: Diagnosis not present

## 2021-07-20 MED ORDER — AMPHETAMINE-DEXTROAMPHETAMINE 10 MG PO TABS
10.0000 mg | ORAL_TABLET | Freq: Two times a day (BID) | ORAL | 0 refills | Status: DC
  Filled 2021-07-20: qty 60, 30d supply, fill #0

## 2021-07-21 ENCOUNTER — Other Ambulatory Visit (HOSPITAL_COMMUNITY): Payer: Self-pay

## 2021-08-01 ENCOUNTER — Encounter: Payer: Self-pay | Admitting: *Deleted

## 2021-08-28 ENCOUNTER — Other Ambulatory Visit (HOSPITAL_COMMUNITY): Payer: Self-pay

## 2021-08-29 ENCOUNTER — Other Ambulatory Visit (HOSPITAL_COMMUNITY): Payer: Self-pay

## 2021-08-29 MED ORDER — AMPHETAMINE-DEXTROAMPHETAMINE 10 MG PO TABS
10.0000 mg | ORAL_TABLET | Freq: Two times a day (BID) | ORAL | 0 refills | Status: AC
  Filled 2021-08-29: qty 60, 30d supply, fill #0

## 2021-12-13 ENCOUNTER — Other Ambulatory Visit (HOSPITAL_COMMUNITY): Payer: Self-pay

## 2021-12-13 DIAGNOSIS — G43909 Migraine, unspecified, not intractable, without status migrainosus: Secondary | ICD-10-CM | POA: Diagnosis not present

## 2021-12-13 DIAGNOSIS — M545 Low back pain, unspecified: Secondary | ICD-10-CM | POA: Diagnosis not present

## 2021-12-13 DIAGNOSIS — F909 Attention-deficit hyperactivity disorder, unspecified type: Secondary | ICD-10-CM | POA: Diagnosis not present

## 2021-12-13 DIAGNOSIS — H109 Unspecified conjunctivitis: Secondary | ICD-10-CM | POA: Diagnosis not present

## 2021-12-13 MED ORDER — RIZATRIPTAN BENZOATE 10 MG PO TABS
10.0000 mg | ORAL_TABLET | Freq: Every day | ORAL | 1 refills | Status: AC
  Filled 2021-12-13: qty 12, 6d supply, fill #0

## 2021-12-13 MED ORDER — AMPHETAMINE-DEXTROAMPHETAMINE 10 MG PO TABS
10.0000 mg | ORAL_TABLET | Freq: Two times a day (BID) | ORAL | 0 refills | Status: DC
  Filled 2021-12-13: qty 60, 30d supply, fill #0

## 2021-12-13 MED ORDER — METHOCARBAMOL 500 MG PO TABS
500.0000 mg | ORAL_TABLET | Freq: Two times a day (BID) | ORAL | 0 refills | Status: AC
  Filled 2021-12-13: qty 60, 30d supply, fill #0

## 2021-12-15 ENCOUNTER — Ambulatory Visit (HOSPITAL_COMMUNITY)
Admission: RE | Admit: 2021-12-15 | Discharge: 2021-12-15 | Disposition: A | Discharge status: Home / Self Care | Payer: 59 | Source: Ambulatory Visit | Attending: Family Medicine | Admitting: Family Medicine

## 2021-12-15 DIAGNOSIS — Z1231 Encounter for screening mammogram for malignant neoplasm of breast: Secondary | ICD-10-CM | POA: Insufficient documentation

## 2022-01-10 ENCOUNTER — Encounter: Payer: Self-pay | Admitting: *Deleted

## 2022-02-14 ENCOUNTER — Other Ambulatory Visit (HOSPITAL_COMMUNITY): Payer: Self-pay

## 2022-02-14 ENCOUNTER — Other Ambulatory Visit: Payer: Self-pay

## 2022-02-16 ENCOUNTER — Other Ambulatory Visit (HOSPITAL_COMMUNITY): Payer: Self-pay

## 2022-02-16 MED ORDER — AMPHETAMINE-DEXTROAMPHETAMINE 10 MG PO TABS
10.0000 mg | ORAL_TABLET | Freq: Two times a day (BID) | ORAL | 0 refills | Status: DC
  Filled 2022-02-16 – 2022-02-17 (×2): qty 60, 30d supply, fill #0

## 2022-02-17 ENCOUNTER — Other Ambulatory Visit: Payer: Self-pay

## 2022-02-17 ENCOUNTER — Other Ambulatory Visit (HOSPITAL_COMMUNITY): Payer: Self-pay

## 2022-02-20 ENCOUNTER — Other Ambulatory Visit (HOSPITAL_COMMUNITY): Payer: Self-pay

## 2022-02-21 ENCOUNTER — Other Ambulatory Visit (HOSPITAL_COMMUNITY): Payer: Self-pay

## 2022-04-14 ENCOUNTER — Other Ambulatory Visit (HOSPITAL_COMMUNITY): Payer: Self-pay

## 2022-04-15 ENCOUNTER — Other Ambulatory Visit (HOSPITAL_COMMUNITY): Payer: Self-pay

## 2022-04-15 MED ORDER — AMPHETAMINE-DEXTROAMPHETAMINE 10 MG PO TABS
10.0000 mg | ORAL_TABLET | Freq: Two times a day (BID) | ORAL | 0 refills | Status: DC
  Filled 2022-04-15 – 2022-04-17 (×2): qty 60, 30d supply, fill #0

## 2022-04-17 ENCOUNTER — Other Ambulatory Visit (HOSPITAL_COMMUNITY): Payer: Self-pay

## 2022-04-18 ENCOUNTER — Other Ambulatory Visit (HOSPITAL_COMMUNITY): Payer: Self-pay

## 2022-06-16 ENCOUNTER — Other Ambulatory Visit (HOSPITAL_COMMUNITY): Payer: Self-pay

## 2022-06-20 ENCOUNTER — Other Ambulatory Visit (HOSPITAL_COMMUNITY): Payer: Self-pay

## 2022-06-20 MED ORDER — AMPHETAMINE-DEXTROAMPHETAMINE 10 MG PO TABS
10.0000 mg | ORAL_TABLET | Freq: Two times a day (BID) | ORAL | 0 refills | Status: DC
  Filled 2022-06-20: qty 60, 30d supply, fill #0

## 2022-06-22 ENCOUNTER — Other Ambulatory Visit (HOSPITAL_COMMUNITY): Payer: Self-pay

## 2022-06-22 MED ORDER — AMPHETAMINE-DEXTROAMPHETAMINE 10 MG PO TABS
10.0000 mg | ORAL_TABLET | Freq: Two times a day (BID) | ORAL | 0 refills | Status: AC
  Filled 2022-10-10: qty 60, 30d supply, fill #0

## 2022-06-23 ENCOUNTER — Other Ambulatory Visit (HOSPITAL_COMMUNITY): Payer: Self-pay

## 2022-06-23 MED ORDER — AMPHETAMINE-DEXTROAMPHETAMINE 10 MG PO TABS
10.0000 mg | ORAL_TABLET | Freq: Two times a day (BID) | ORAL | 0 refills | Status: AC
  Filled 2022-06-23 – 2022-08-01 (×2): qty 60, 30d supply, fill #0

## 2022-07-19 DIAGNOSIS — E782 Mixed hyperlipidemia: Secondary | ICD-10-CM | POA: Diagnosis not present

## 2022-08-01 ENCOUNTER — Other Ambulatory Visit (HOSPITAL_COMMUNITY): Payer: Self-pay

## 2022-08-17 ENCOUNTER — Other Ambulatory Visit: Payer: Self-pay | Admitting: Oncology

## 2022-08-17 DIAGNOSIS — Z006 Encounter for examination for normal comparison and control in clinical research program: Secondary | ICD-10-CM

## 2022-10-10 ENCOUNTER — Other Ambulatory Visit (HOSPITAL_COMMUNITY): Payer: Self-pay

## 2022-11-20 ENCOUNTER — Other Ambulatory Visit (HOSPITAL_COMMUNITY): Payer: Self-pay | Admitting: Family Medicine

## 2022-11-20 DIAGNOSIS — Z1231 Encounter for screening mammogram for malignant neoplasm of breast: Secondary | ICD-10-CM

## 2022-12-18 ENCOUNTER — Ambulatory Visit (HOSPITAL_COMMUNITY)
Admission: RE | Admit: 2022-12-18 | Discharge: 2022-12-18 | Disposition: A | Discharge status: Home / Self Care | Payer: Commercial Managed Care - PPO | Source: Ambulatory Visit | Attending: Family Medicine | Admitting: Family Medicine

## 2022-12-18 DIAGNOSIS — Z1231 Encounter for screening mammogram for malignant neoplasm of breast: Secondary | ICD-10-CM | POA: Diagnosis not present

## 2023-02-05 ENCOUNTER — Other Ambulatory Visit (HOSPITAL_BASED_OUTPATIENT_CLINIC_OR_DEPARTMENT_OTHER): Payer: Self-pay

## 2023-02-05 ENCOUNTER — Other Ambulatory Visit (HOSPITAL_COMMUNITY): Payer: Self-pay

## 2023-02-05 MED ORDER — RIZATRIPTAN BENZOATE 10 MG PO TABS
10.0000 mg | ORAL_TABLET | ORAL | 1 refills | Status: AC
  Filled 2023-02-05: qty 12, 15d supply, fill #0
  Filled 2023-06-03: qty 12, 15d supply, fill #1

## 2023-02-05 MED ORDER — AMPHETAMINE-DEXTROAMPHETAMINE 10 MG PO TABS
10.0000 mg | ORAL_TABLET | Freq: Two times a day (BID) | ORAL | 0 refills | Status: DC
  Filled 2023-02-05: qty 60, 30d supply, fill #0

## 2023-02-05 MED ORDER — METHOCARBAMOL 500 MG PO TABS
500.0000 mg | ORAL_TABLET | Freq: Two times a day (BID) | ORAL | 0 refills | Status: AC
  Filled 2023-02-05: qty 60, 30d supply, fill #0

## 2023-02-15 ENCOUNTER — Encounter: Payer: Self-pay | Admitting: *Deleted

## 2023-02-23 ENCOUNTER — Telehealth: Payer: Self-pay | Admitting: Genetic Counselor

## 2023-02-23 NOTE — Telephone Encounter (Signed)
 Scheduled appointments per scheduling message. Patient is aware of the made appointments and is active on MyChart.

## 2023-03-02 DIAGNOSIS — E782 Mixed hyperlipidemia: Secondary | ICD-10-CM | POA: Diagnosis not present

## 2023-03-12 ENCOUNTER — Other Ambulatory Visit: Payer: Self-pay

## 2023-03-12 ENCOUNTER — Other Ambulatory Visit (HOSPITAL_COMMUNITY): Payer: Self-pay

## 2023-03-12 MED ORDER — AMPHETAMINE-DEXTROAMPHETAMINE 10 MG PO TABS
10.0000 mg | ORAL_TABLET | Freq: Two times a day (BID) | ORAL | 0 refills | Status: DC
  Filled 2023-03-12: qty 60, 30d supply, fill #0

## 2023-03-13 ENCOUNTER — Other Ambulatory Visit: Payer: Self-pay

## 2023-03-13 ENCOUNTER — Other Ambulatory Visit (HOSPITAL_COMMUNITY): Payer: Self-pay

## 2023-05-11 ENCOUNTER — Encounter: Payer: Self-pay | Admitting: Genetic Counselor

## 2023-05-11 ENCOUNTER — Inpatient Hospital Stay: Payer: Commercial Managed Care - PPO | Attending: Genetic Counselor | Admitting: Genetic Counselor

## 2023-05-11 DIAGNOSIS — Z800 Family history of malignant neoplasm of digestive organs: Secondary | ICD-10-CM

## 2023-05-11 DIAGNOSIS — Z803 Family history of malignant neoplasm of breast: Secondary | ICD-10-CM | POA: Diagnosis not present

## 2023-05-11 DIAGNOSIS — Z1379 Encounter for other screening for genetic and chromosomal anomalies: Secondary | ICD-10-CM

## 2023-05-11 NOTE — Progress Notes (Signed)
 REFERRING PROVIDER: Micael Hampshire, FNP [address] [place],  [place] [address]  PRIMARY PROVIDER:  Micael Hampshire, FNP  PRIMARY REASON FOR VISIT:  1. Family history of malignant neoplasm of breast   2. Family history of malignant neoplasm of gastrointestinal tract     HISTORY OF PRESENT ILLNESS:   Jody Hall, a 53 y.o. female, was seen for a  cancer genetics consultation at the request of Dr. Providence Lanius due to a family history of cancer.  Jody Hall presents to clinic today to discuss the possibility of a hereditary predisposition to cancer, genetic testing, and to further clarify her future cancer risks, as well as potential cancer risks for family members.   Jody Hall is a 53 y.o. female with no personal history of cancer.    RISK FACTORS:  Menarche was at age 73.  First live birth at age 29. OCP use for approximately  7-10  years.  Ovaries intact: yes.  Hysterectomy: yes.  Menopausal status: premenopausal.  HRT use: 0 years. Colonoscopy: no; not examined. Mammogram within the last year: yes. 12/2022 Number of breast biopsies: 0. Any excessive radiation exposure in the past: no  No past medical history on file.  No past surgical history on file.  Social History   Socioeconomic History   Marital status: Married    Spouse name: Not on file   Number of children: Not on file   Years of education: Not on file   Highest education level: Not on file  Occupational History   Not on file  Tobacco Use   Smoking status: Not on file   Smokeless tobacco: Not on file  Substance and Sexual Activity   Alcohol use: Not on file   Drug use: Not on file   Sexual activity: Not on file  Other Topics Concern   Not on file  Social History Narrative   Not on file   Social Drivers of Health   Financial Resource Strain: Not on file  Food Insecurity: Not on file  Transportation Needs: Not on file  Physical Activity: Not on file  Stress: Not on file  Social  Connections: Not on file     FAMILY HISTORY:  We obtained a detailed, 4-generation family history.  Significant diagnoses are listed below: Family History  Problem Relation Age of Onset   Breast cancer Mother 59   Liver cancer Father 60   Colon cancer Maternal Grandfather 60   Thyroid cancer Paternal Grandfather    Kidney cancer Paternal Grandfather    Stomach cancer Paternal Grandfather    Esophageal cancer Paternal Grandfather     Jody Hall is unaware of previous family history of genetic testing for hereditary cancer risks. There is no reported Ashkenazi Jewish ancestry. She reports her mother was diagnosed with breast cancer at age 41 and passed away at age 75. She reports her maternal grandfather was diagnosed with colon cancer at 14. She reports her father was diagnosed with liver cancer at age 63, passed away in his 20s. She reports her paternal grandfather was diagnosed with kidney cancer, stomach cancer and thyroid cancer at unknown ages and died from esophageal cancer in his 35s.   GENETIC COUNSELING ASSESSMENT:  We discussed that, in general, most cancer is not inherited in families, but instead is sporadic or familial. Sporadic cancers occur by chance and typically happen at older ages (>50 years) as this type of cancer is caused by genetic changes acquired during an individual's lifetime. Some families have  more cancers than would be expected by chance; however, the ages or types of cancer are not consistent with a known genetic mutation or known genetic mutations have been ruled out. This type of familial cancer is thought to be due to a combination of multiple genetic, environmental, hormonal, and lifestyle factors. While this combination of factors likely increases the risk of cancer, the exact source of this risk is not currently identifiable or testable.  We discussed that 5 - 10% of cancer is hereditary. We discussed that testing for hereditary cancer is beneficial for  several reasons including knowing how to screen individuals for cancer, identify preventative strategies, and understand if other family members could be at risk for cancer and allow them to undergo genetic testing.   We reviewed the characteristics, features and inheritance patterns of hereditary cancer syndromes. We also discussed genetic testing, including the appropriate family members to test, the process of testing, insurance coverage and turn-around-time for results. We discussed the implications of a negative, positive, carrier and/or variant of uncertain significant result.   We discussed with Jody Hall that the family history does not meet insurance or NCCN criteria for genetic testing and, therefore, is not highly consistent with a familial hereditary cancer syndrome.  We feel she is at low risk to harbor a gene mutation associated with such a condition. Diagnostic genetic testing is unlikely to be covered by insurance at this time, but self-pay testing of $250 can be considered. We do recommended Jody Hall continue to follow the cancer screening guidelines given by her primary healthcare provider.  We discussed that some people do not want to undergo genetic testing due to fear of genetic discrimination.  The Genetic Information Nondiscrimination Act (GINA) was signed into federal law in 2008. GINA prohibits health insurers and most employers from discriminating against individuals based on genetic information (including the results of genetic tests and family history information). According to GINA, health insurance companies cannot consider genetic information to be a preexisting condition, nor can they use it to make decisions regarding coverage or rates. GINA also makes it illegal for most employers to use genetic information in making decisions about hiring, firing, promotion, or terms of employment. It is important to note that GINA does not offer protections for life insurance, disability  insurance, or long-term care insurance. GINA does not apply to those in the [employer], those who work for companies with less than 15 employees, and new life insurance or long-term disability insurance policies.  Health status due to a cancer diagnosis is not protected under GINA. More information about GINA can be found by visiting [email or web address].  The Tyrer-Cuzick model is one of multiple prediction models developed to estimate an individual's lifetime risk of developing breast cancer. The Tyrer-Cuzick model is endorsed by the [employer] (NCCN). This model includes many risk factors such as family history, endogenous estrogen exposure, and benign breast disease. The calculation is highly-dependent on the accuracy of clinical data provided by the patient and can change over time. The Tyrer-Cuzick model may be repeated to reflect new information in her personal or family history in the future.   Based on the patient's family history, a statistical model [employer]) was used to estimate her risk of developing breast cancer. This estimates her lifetime risk of developing breast to be approximately 21.4%. This estimation does not consider any genetic testing results.  The patient's lifetime breast cancer risk is a preliminary estimate based on available information using one of  several models endorsed by the American Cancer Society (ACS). The ACS recommends consideration of breast MRI screening as an adjunct to mammography for patients at high risk (defined as 20% or greater lifetime risk). Please note that a woman's breast cancer risk changes over time. It may increase or decrease based on age and any changes to the personal and/or family medical history. The risks and recommendations listed above apply to this patient at this point in time. In the future, she may or may not be eligible for the same medical management strategies and, in some cases, other medical management strategies  may become available to her. If she is interested in an updated breast cancer risk assessment at a later date, she can contact us.  Jody Hall has been determined to be at high risk for breast cancer.  her Tyrer-Cuzick risk score is 21.4%.  For women with a greater than 20% lifetime risk of breast cancer, the [employer] (NCCN) recommends the following:  1.      Clinical encounter every 6-12 months to begin when identified as being at increased risk, but not before age 61  2.      Annual mammograms. Tomosynthesis is recommended starting 10 years earlier than the youngest breast cancer diagnosis in the family or at age 53 (whichever comes first), but not before age 44   67.      Annual breast MRI starting 10 years earlier than the youngest breast cancer diagnosis in the family or at age 53 (whichever comes first), but not before age 42.      PLAN: After considering the risks, benefits, and limitations, Jody Hall declined to complete testing at this time. We remain available to coordinate genetic testing at any time in the future. We, therefore, recommend Jody Hall continue to follow the cancer screening guidelines given by her primary healthcare provider.  Jody Hall does qualify for a referral to the high risk breast program at the [facility]. Additionally, referral to Dr. Ellin Saba at [facility] can be considered. Jody Hall is interested in establishing at [facility] if possible.   Lastly, we encouraged Jody Hall to remain in contact with cancer genetics annually so that we can continuously update the family history and inform her of any changes in cancer genetics and testing that may be of benefit for this family.   Jody Hall's questions were answered to her satisfaction today. Our contact information was provided should additional questions or concerns arise. Thank you for the referral and allowing [place] to share in the care of your patient.    Vassie Moment, MS, [facility] Licensed, Retail [email or web address] phone: [phone]  45 minutes were spent on the date of the encounter in service to the patient including preparation, face-to-face consultation, documentation and care coordination.  The patient was seen alone.  Drs. Meliton Rattan, and/or [place] were available for questions, if needed.   I connected with  Jody Hall on 05/11/2023 at 9am EDT by telephone and verified that I am speaking with the correct person using two identifiers.   Patient location: home Provider location: office _______________________________________________________________________ For Office Staff:  Number of people involved in session: 1 Was an Intern/ student involved with case: yes; [employer] Intern Candace Teague participated in this visit with direct supervision by me.

## 2023-05-14 ENCOUNTER — Other Ambulatory Visit (HOSPITAL_COMMUNITY)

## 2023-05-14 ENCOUNTER — Encounter: Payer: Self-pay | Admitting: Genetic Counselor

## 2023-05-22 ENCOUNTER — Other Ambulatory Visit (HOSPITAL_COMMUNITY): Payer: Self-pay

## 2023-05-23 ENCOUNTER — Other Ambulatory Visit (HOSPITAL_COMMUNITY): Payer: Self-pay

## 2023-05-23 MED ORDER — AMPHETAMINE-DEXTROAMPHETAMINE 10 MG PO TABS
10.0000 mg | ORAL_TABLET | Freq: Two times a day (BID) | ORAL | 0 refills | Status: DC
  Filled 2023-05-23: qty 60, 30d supply, fill #0

## 2023-05-24 ENCOUNTER — Other Ambulatory Visit (HOSPITAL_COMMUNITY): Payer: Self-pay

## 2023-06-04 ENCOUNTER — Other Ambulatory Visit: Payer: Self-pay

## 2023-06-12 ENCOUNTER — Other Ambulatory Visit (HOSPITAL_COMMUNITY): Payer: Self-pay

## 2023-06-12 MED ORDER — AMPHETAMINE-DEXTROAMPHETAMINE 10 MG PO TABS
10.0000 mg | ORAL_TABLET | Freq: Two times a day (BID) | ORAL | 0 refills | Status: DC
  Filled 2023-07-24: qty 60, 30d supply, fill #0

## 2023-06-13 ENCOUNTER — Other Ambulatory Visit (HOSPITAL_COMMUNITY): Payer: Self-pay

## 2023-07-24 ENCOUNTER — Other Ambulatory Visit (HOSPITAL_COMMUNITY): Payer: Self-pay

## 2023-07-24 ENCOUNTER — Other Ambulatory Visit: Payer: Self-pay

## 2023-07-30 ENCOUNTER — Inpatient Hospital Stay: Admitting: Hematology

## 2023-07-30 ENCOUNTER — Inpatient Hospital Stay

## 2023-08-16 ENCOUNTER — Encounter (INDEPENDENT_AMBULATORY_CARE_PROVIDER_SITE_OTHER): Payer: Self-pay | Admitting: *Deleted

## 2023-09-07 DIAGNOSIS — Z8349 Family history of other endocrine, nutritional and metabolic diseases: Secondary | ICD-10-CM | POA: Diagnosis not present

## 2023-09-07 DIAGNOSIS — E782 Mixed hyperlipidemia: Secondary | ICD-10-CM | POA: Diagnosis not present

## 2023-09-10 ENCOUNTER — Other Ambulatory Visit (HOSPITAL_COMMUNITY): Payer: Self-pay

## 2023-09-10 ENCOUNTER — Other Ambulatory Visit: Payer: Self-pay

## 2023-09-10 MED ORDER — AMPHETAMINE-DEXTROAMPHETAMINE 10 MG PO TABS
10.0000 mg | ORAL_TABLET | Freq: Two times a day (BID) | ORAL | 0 refills | Status: DC
  Filled 2023-09-10: qty 60, 30d supply, fill #0

## 2023-09-11 ENCOUNTER — Encounter (INDEPENDENT_AMBULATORY_CARE_PROVIDER_SITE_OTHER): Payer: Self-pay | Admitting: *Deleted

## 2023-11-28 ENCOUNTER — Other Ambulatory Visit: Payer: Self-pay | Admitting: Medical Genetics

## 2023-11-28 DIAGNOSIS — Z006 Encounter for examination for normal comparison and control in clinical research program: Secondary | ICD-10-CM

## 2023-12-24 ENCOUNTER — Other Ambulatory Visit (HOSPITAL_COMMUNITY): Payer: Self-pay

## 2023-12-25 ENCOUNTER — Other Ambulatory Visit (HOSPITAL_COMMUNITY): Payer: Self-pay

## 2023-12-25 ENCOUNTER — Other Ambulatory Visit: Payer: Self-pay

## 2023-12-25 MED ORDER — AMPHETAMINE-DEXTROAMPHETAMINE 10 MG PO TABS
10.0000 mg | ORAL_TABLET | Freq: Two times a day (BID) | ORAL | 0 refills | Status: DC
  Filled 2023-12-25: qty 60, 30d supply, fill #0

## 2024-02-08 ENCOUNTER — Other Ambulatory Visit (HOSPITAL_COMMUNITY): Payer: Self-pay

## 2024-02-11 ENCOUNTER — Other Ambulatory Visit (HOSPITAL_COMMUNITY): Payer: Self-pay

## 2024-02-11 MED ORDER — AMPHETAMINE-DEXTROAMPHETAMINE 10 MG PO TABS
10.0000 mg | ORAL_TABLET | Freq: Two times a day (BID) | ORAL | 0 refills | Status: DC
  Filled 2024-02-11: qty 60, 30d supply, fill #0

## 2024-02-12 ENCOUNTER — Other Ambulatory Visit: Payer: Self-pay

## 2024-03-14 ENCOUNTER — Other Ambulatory Visit (HOSPITAL_COMMUNITY): Payer: Self-pay

## 2024-03-14 ENCOUNTER — Other Ambulatory Visit: Payer: Self-pay

## 2024-03-14 MED ORDER — METHOCARBAMOL 500 MG PO TABS
500.0000 mg | ORAL_TABLET | Freq: Two times a day (BID) | ORAL | 0 refills | Status: AC
  Filled 2024-03-14: qty 60, 30d supply, fill #0

## 2024-03-14 MED ORDER — RIZATRIPTAN BENZOATE 10 MG PO TABS
ORAL_TABLET | ORAL | 1 refills | Status: AC
  Filled 2024-03-14: qty 12, 15d supply, fill #0

## 2024-03-14 MED ORDER — AMPHETAMINE-DEXTROAMPHETAMINE 10 MG PO TABS
10.0000 mg | ORAL_TABLET | Freq: Two times a day (BID) | ORAL | 0 refills | Status: AC
  Filled 2024-03-14: qty 60, 30d supply, fill #0
